# Patient Record
Sex: Female | Born: 1940 | Race: White | Hispanic: No | Marital: Married | Smoking: Former smoker
Health system: Southern US, Community
[De-identification: ages and names within clinical notes are randomized; demographics above are authoritative.]

## PROBLEM LIST (undated history)

## (undated) DIAGNOSIS — J449 Chronic obstructive pulmonary disease, unspecified: Secondary | ICD-10-CM

## (undated) DIAGNOSIS — F419 Anxiety disorder, unspecified: Secondary | ICD-10-CM

## (undated) DIAGNOSIS — F32A Depression, unspecified: Secondary | ICD-10-CM

## (undated) DIAGNOSIS — E119 Type 2 diabetes mellitus without complications: Secondary | ICD-10-CM

## (undated) HISTORY — PX: EYE SURGERY: SHX253

---

## 2020-06-13 ENCOUNTER — Emergency Department: Payer: Medicare Other

## 2020-06-13 ENCOUNTER — Emergency Department
Admission: EM | Admit: 2020-06-13 | Discharge: 2020-06-14 | Disposition: A | Discharge status: Home / Self Care | Payer: Medicare Other | Attending: Emergency Medicine | Admitting: Emergency Medicine

## 2020-06-13 ENCOUNTER — Other Ambulatory Visit: Payer: Self-pay

## 2020-06-13 DIAGNOSIS — Z87891 Personal history of nicotine dependence: Secondary | ICD-10-CM | POA: Insufficient documentation

## 2020-06-13 DIAGNOSIS — T148XXA Other injury of unspecified body region, initial encounter: Secondary | ICD-10-CM

## 2020-06-13 DIAGNOSIS — E119 Type 2 diabetes mellitus without complications: Secondary | ICD-10-CM | POA: Diagnosis not present

## 2020-06-13 DIAGNOSIS — W07XXXA Fall from chair, initial encounter: Secondary | ICD-10-CM | POA: Insufficient documentation

## 2020-06-13 DIAGNOSIS — J449 Chronic obstructive pulmonary disease, unspecified: Secondary | ICD-10-CM | POA: Diagnosis not present

## 2020-06-13 DIAGNOSIS — S52502A Unspecified fracture of the lower end of left radius, initial encounter for closed fracture: Secondary | ICD-10-CM

## 2020-06-13 DIAGNOSIS — S59912A Unspecified injury of left forearm, initial encounter: Secondary | ICD-10-CM | POA: Diagnosis present

## 2020-06-13 DIAGNOSIS — S52602A Unspecified fracture of lower end of left ulna, initial encounter for closed fracture: Secondary | ICD-10-CM

## 2020-06-13 HISTORY — DX: Type 2 diabetes mellitus without complications: E11.9

## 2020-06-13 HISTORY — DX: Chronic obstructive pulmonary disease, unspecified: J44.9

## 2020-06-13 LAB — GLUCOSE, CAPILLARY: Glucose-Capillary: 178 mg/dL — ABNORMAL HIGH (ref 70–99)

## 2020-06-13 MED ORDER — ONDANSETRON HCL 4 MG/2ML IJ SOLN
4.0000 mg | Freq: Once | INTRAMUSCULAR | Status: AC
  Administered 2020-06-13: 4 mg via INTRAVENOUS
  Filled 2020-06-13: qty 2

## 2020-06-13 MED ORDER — ACETAMINOPHEN 500 MG PO TABS
1000.0000 mg | ORAL_TABLET | Freq: Once | ORAL | Status: DC
  Filled 2020-06-13: qty 2

## 2020-06-13 MED ORDER — LACTATED RINGERS IV BOLUS
1000.0000 mL | Freq: Once | INTRAVENOUS | Status: AC
  Administered 2020-06-13: 1000 mL via INTRAVENOUS

## 2020-06-13 MED ORDER — LIDOCAINE HCL (PF) 1 % IJ SOLN
30.0000 mL | Freq: Once | INTRAMUSCULAR | Status: AC
  Administered 2020-06-13: 20 mL
  Filled 2020-06-13: qty 30

## 2020-06-13 MED ORDER — BUPIVACAINE HCL (PF) 0.5 % IJ SOLN
50.0000 mL | Freq: Once | INTRAMUSCULAR | Status: AC
  Administered 2020-06-13: 50 mL
  Filled 2020-06-13: qty 60

## 2020-06-13 MED ORDER — HYDROCODONE-ACETAMINOPHEN 5-325 MG PO TABS: 1.0000 | ORAL_TABLET | Freq: Four times a day (QID) | ORAL | 0 refills | Status: DC | PRN

## 2020-06-13 MED ORDER — KETAMINE HCL 10 MG/ML IJ SOLN
1.0000 mg/kg | Freq: Once | INTRAMUSCULAR | Status: AC
  Administered 2020-06-13: 19.5 mg via INTRAVENOUS
  Filled 2020-06-13: qty 1

## 2020-06-13 MED ORDER — SODIUM BICARBONATE 8.4 % IV SOLN
50.0000 meq | Freq: Once | INTRAVENOUS | Status: DC
  Filled 2020-06-13: qty 50

## 2020-06-13 MED ORDER — FENTANYL CITRATE (PF) 100 MCG/2ML IJ SOLN
25.0000 ug | Freq: Once | INTRAMUSCULAR | Status: AC
  Administered 2020-06-13: 25 ug via INTRAVENOUS
  Filled 2020-06-13: qty 2

## 2020-06-13 NOTE — ED Provider Notes (Signed)
[facility] Emergency Department Provider Note   ____________________________________________   First MD Initiated Contact with Patient 06/13/20 1338     (approximate)  I have reviewed the triage vital signs and the nursing notes.   HISTORY  Chief Complaint Fall and Wrist Pain (left with deformity)   HPI Danielle Yu is a 79 y.o. female is brought to the ED by EMS with complaint of left wrist pain.  Patient states that she was trying to pick up her dog and fell between her chair and her bookcase.  She denies any head injury or loss of consciousness.  She denies any other injury other than her left wrist.  Patient states that she called 911 as her husband is playing golf.  History of COPD and constant use O2 2 L by nasal cannula at home.  Patient also takes Metformin for "prediabetes".  Currently she rates her pain as 10/10.       Past Medical History:  Diagnosis Date  . COPD (chronic obstructive pulmonary disease) (HCC)   . Diabetes mellitus without complication (HCC)     There are no problems to display for this patient.   Prior to Admission medications   Medication Sig Start Date End Date Taking? Authorizing Provider  HYDROcodone-acetaminophen (NORCO/VICODIN) 5-325 MG tablet Take 1 tablet by mouth every 6 (six) hours as needed for moderate pain. 06/13/20   Tommi Rumps, PA-C    Allergies Patient has no known allergies.  No family history on file.  Social History Social History   Tobacco Use  . Smoking status: Former Smoker    Quit date: 2013    Years since quitting: 8.7  . Smokeless tobacco: Never Used  Substance Use Topics  . Alcohol use: Not on file  . Drug use: Not on file    Review of Systems Constitutional: No fever/chills Eyes: No visual changes. ENT: No trauma. Cardiovascular: Denies chest pain. Respiratory: Denies shortness of breath. Gastrointestinal: No abdominal pain.  Positive nausea, no vomiting.     Musculoskeletal: Positive for left wrist pain. Skin: Negative for rash. Neurological: Negative for headaches, focal weakness or numbness.  ____________________________________________   PHYSICAL EXAM:  VITAL SIGNS: ED Triage Vitals  Enc Vitals Group     BP 06/13/20 1322 (!) 126/57     Pulse Rate 06/13/20 1322 88     Resp 06/13/20 1322 18     Temp 06/13/20 1322 98.3 F (36.8 C)     Temp Source 06/13/20 1322 Oral     SpO2 06/13/20 1322 97 %     Weight 06/13/20 1317 145 lb (65.8 kg)     Height 06/13/20 1317 5\' 2"  (1.575 m)     Head Circumference --      Peak Flow --      Pain Score 06/13/20 1317 10     Pain Loc --      Pain Edu? --      Excl. in GC? --     Constitutional: Alert and oriented. Well appearing and in no acute distress. Eyes: Conjunctivae are normal. PERRL. EOMI. Head: Atraumatic. Nose: No congestion/rhinnorhea. Mouth/Throat: Mucous membranes are moist.  Oropharynx non-erythematous. Neck: No stridor.   Cardiovascular: Normal rate, regular rhythm. Grossly normal heart sounds.  Good peripheral circulation. Respiratory: Normal respiratory effort.  No retractions. Lungs CTAB. Gastrointestinal: Soft and nontender. No distention. Musculoskeletal: No lower extremity tenderness nor edema.  No joint effusions. Neurologic:  Normal speech and language. No gross focal neurologic deficits are appreciated.  No gait instability. Skin:  Skin is warm, dry and intact. No rash noted. Psychiatric: Mood and affect are normal. Speech and behavior are normal.  ____________________________________________   LABS (all labs ordered are listed, but only abnormal results are displayed)  Labs Reviewed  GLUCOSE, CAPILLARY - Abnormal; Notable for the following components:      Result Value   Glucose-Capillary 178 (*)    All other components within normal limits  CBG MONITORING, ED   ____________________________________________   RADIOLOGY   Official radiology report(s): DG  Wrist Complete Left  Result Date: 06/13/2020 CLINICAL DATA:  Left wrist injury with deformity. Pt states she was trying to pick up her small dog when she fell between her chair and her bookcase. EXAM: LEFT WRIST - COMPLETE 3+ VIEW COMPARISON:  None. FINDINGS: There are displaced and angulated fractures in the distal radius and in the distal ulna. Carpal bones appear intact. No evidence of dislocation. There is prominent regional soft tissue swelling. IMPRESSION: Displaced and angulated fractures of the distal radius and distal ulna. Electronically Signed   By: Emmaline Kluver M.D.   On: 06/13/2020 14:11    ____________________________________________   PROCEDURES  Procedure(s) performed (including Critical Care):  Procedures   ____________________________________________   INITIAL IMPRESSION / ASSESSMENT AND PLAN / ED COURSE  As part of my medical decision making, I reviewed the following data within the electronic MEDICAL RECORD NUMBER Notes from prior ED visits and [place] Controlled Substance Database  ----------------------------------------- 3:00 PM on 06/13/2020 ----------------------------------------- Spoke with Dr. Hyacinth Meeker who is on-call for orthopedics.  Requested sedation and closed reduction by ED physician.  Sugar tong splint and sling will be applied afterwards.  Patient is to follow-up with Dr. Hyacinth Meeker in the office.  ----------------------------------------- 3:25 PM on 06/13/2020 ----------------------------------------- Dr. Antoine Primas is in [place] to  see the patient and talk to her about procedure for closed reduction of her left distal radial/ulna fracture.  ____________________________________________   FINAL CLINICAL IMPRESSION(S) / ED DIAGNOSES  Final diagnoses:  Closed fracture distal radius and ulna, left, initial encounter     ED Discharge Orders         Ordered    HYDROcodone-acetaminophen (NORCO/VICODIN) 5-325 MG tablet  Every 6 hours PRN         06/13/20 1530          *Please note:  Danielle Yu was evaluated in Emergency Department on 06/13/2020 for the symptoms described in the history of present illness. She was evaluated in the context of the global COVID-19 pandemic, which necessitated consideration that the patient might be at risk for infection with the SARS-CoV-2 virus that causes COVID-19. Institutional protocols and algorithms that pertain to the evaluation of patients at risk for COVID-19 are in a state of rapid change based on information released by regulatory bodies including the CDC and federal and state organizations. These policies and algorithms were followed during the patient's care in the ED.  Some ED evaluations and interventions may be delayed as a result of limited staffing during and the pandemic.*   Note:  This document was prepared using Dragon voice recognition software and may include unintentional dictation errors.    Tommi Rumps, PA-C 06/14/20 1103    Gilles Chiquito, MD 06/14/20 1123

## 2020-06-13 NOTE — Consult Note (Signed)
ORTHOPAEDIC CONSULTATION  REQUESTING PHYSICIAN: Gilles Chiquito, MD  Chief Complaint: Left wrist pain  HPI: Danielle Yu is a 79 y.o. female who complains of left wrist pain after a fall at home this afternoon.  She bent over to pick up her dog and slipped.  She was brought to the emergency room where exam and x-rays reveal a completely displaced left distal radius and ulna fracture.  Attempts at closed reduction under sedation were not really successful and I was asked to come into help with this patient.  The x-rays show a  residual displacement which I think we can improve with hematoma block and closed reduction.  The patient and her husband were agreeable to this.  She is a high risk surgical patient on 3 L of oxygen at home.  Past Medical History:  Diagnosis Date  . COPD (chronic obstructive pulmonary disease) (HCC)   . Diabetes mellitus without complication (HCC)   Were exactly what the hell is Social History   Socioeconomic History  . Marital status: Married    Spouse name: Not on file  . Number of children: Not on file  . Years of education: Not on file  . Highest education level: Not on file  Occupational History  . Not on file  Tobacco Use  . Smoking status: Former Smoker    Quit date: 2013    Years since quitting: 8.7  . Smokeless tobacco: Never Used  Substance and Sexual Activity  . Alcohol use: Not on file  . Drug use: Not on file  . Sexual activity: Not on file  Other Topics Concern  . Not on file  Social History Narrative  . Not on file   Social Determinants of Health   Financial Resource Strain:   . Difficulty of Paying Living Expenses: Not on file  Food Insecurity:   . Worried About Programme researcher, broadcasting/film/video in the Last Year: Not on file  . Ran Out of Food in the Last Year: Not on file  Transportation Needs:   . Lack of Transportation (Medical): Not on file  . Lack of Transportation (Non-Medical): Not on file  Physical Activity:   . Days of Exercise  per Week: Not on file  . Minutes of Exercise per Session: Not on file  Stress:   . Feeling of Stress : Not on file  Social Connections:   . Frequency of Communication with Friends and Family: Not on file  . Frequency of Social Gatherings with Friends and Family: Not on file  . Attends Religious Services: Not on file  . Active Member of Clubs or Organizations: Not on file  . Attends Banker Meetings: Not on file  . Marital Status: Not on file   No family history on file. No Known Allergies Prior to Admission medications   Medication Sig Start Date End Date Taking? Authorizing Provider  HYDROcodone-acetaminophen (NORCO/VICODIN) 5-325 MG tablet Take 1 tablet by mouth every 6 (six) hours as needed for moderate pain. 06/13/20   Tommi Rumps, PA-C   DG Wrist 2 Views Left  Result Date: 06/13/2020 CLINICAL DATA:  Post reduction. EXAM: LEFT WRIST - 2 VIEW COMPARISON:  Left wrist radiographs 06/13/2020 FINDINGS: Redemonstrated displaced fractures of the distal radius and ulna. There is decreased dislocation and angulation of both fractures. There is a remains approximately 8 mm of displacement. No new acute abnormality identified. IMPRESSION: Improved alignment status post reduction. Electronically Signed   By: Emmaline Kluver M.D.   On:  06/13/2020 18:05   DG Wrist Complete Left  Result Date: 06/13/2020 CLINICAL DATA:  Left wrist injury with deformity. Pt states she was trying to pick up her small dog when she fell between her chair and her bookcase. EXAM: LEFT WRIST - COMPLETE 3+ VIEW COMPARISON:  None. FINDINGS: There are displaced and angulated fractures in the distal radius and in the distal ulna. Carpal bones appear intact. No evidence of dislocation. There is prominent regional soft tissue swelling. IMPRESSION: Displaced and angulated fractures of the distal radius and distal ulna. Electronically Signed   By: Emmaline Kluver M.D.   On: 06/13/2020 14:11    Positive ROS: All  other systems have been reviewed and were otherwise negative with the exception of those mentioned in the HPI and as above.  Physical Exam: General: Alert, no acute distress Cardiovascular: No pedal edema Respiratory: No cyanosis, no use of accessory musculature GI: No organomegaly, abdomen is soft and non-tender Skin: No lesions in the area of chief complaint Neurologic: Sensation intact distally Psychiatric: Patient is competent for consent with normal mood and affect Lymphatic: No axillary or cervical lymphadenopathy  MUSCULOSKELETAL: Left wrist remains deformed in a different vendor for correction.  There is moderate swelling.  The skin is intact.  Sensation is good distally.  There is some bruising volarly.  Assessment: Displaced left distal radius and ulnar fractures  Plan: The wrist was prepped sterilely and the both fracture sites infiltrated sterilely with half percent Marcaine and 1% Xylocaine mixture.  After waiting 15 minutes a closed reduction was carried out using fingertrap traction and 10 pounds of countertraction.  X-rays showed improved alignment although not perfect I think this is acceptable and there is no residual angulation of note.  A well-padded sugar tong splint was applied.  The patient will be discharged home to ice and elevate this.  She will take pain medicines as prescribed by the emergency room staff.  She will move the fingers and keep the arm elevated at all times.  She will return to the office in 4 days for exam and x-rays. She was advised to call my office anytime in can come to our urgent care up until 9 PM 7 days a week if necessary.     Valinda Hoar, MD [phone]   06/13/2020 9:28 PM

## 2020-06-13 NOTE — ED Provider Notes (Signed)
.Sedation  Date/Time: 06/13/2020 6:11 PM Performed by: Gilles Chiquito, MD Authorized by: Gilles Chiquito, MD   Consent:    Consent obtained:  Verbal and written   Consent given by:  Patient   Risks discussed:  Allergic reaction, dysrhythmia, inadequate sedation, nausea, vomiting, respiratory compromise necessitating ventilatory assistance and intubation, prolonged sedation necessitating reversal and prolonged hypoxia resulting in organ damage   Alternatives discussed:  Analgesia without sedation Indications:    Procedure performed:  Fracture reduction   Procedure necessitating sedation performed by:  Physician performing sedation Pre-sedation assessment:    NPO status caution: unable to specify NPO status     ASA classification: class 3 - patient with severe systemic disease     Neck mobility: normal     Mallampati score:  II - soft palate, uvula, fauces visible   Pre-sedation assessments completed and reviewed: pre-procedure airway patency not reviewed, pre-procedure cardiovascular function not reviewed, pre-procedure hydration status not reviewed, pre-procedure mental status not reviewed, pre-procedure nausea and vomiting status not reviewed, pre-procedure pain level not reviewed, pre-procedure respiratory function not reviewed and pre-procedure temperature not reviewed   Immediate pre-procedure details:    Reassessment: Patient reassessed immediately prior to procedure     Reviewed: vital signs and relevant labs/tests     Verified: bag valve mask available, emergency equipment available, intubation equipment available, IV patency confirmed, oxygen available and suction available   Procedure details (see MAR for exact dosages):    Preoxygenation:  Nasal cannula   Sedation:  Ketamine   Intended level of sedation: moderate (conscious sedation)   Analgesia:  None   Intra-procedure monitoring:  Blood pressure monitoring, cardiac monitor, continuous capnometry, continuous pulse oximetry,  frequent LOC assessments and frequent vital sign checks   Intra-procedure events: none     Total Provider sedation time (minutes):  15 Post-procedure details:    Attendance: Constant attendance by certified staff until patient recovered   Reduction of fracture  Date/Time: 06/13/2020 6:12 PM Performed by: Gilles Chiquito, MD Authorized by: Gilles Chiquito, MD  Local anesthesia used: no  Anesthesia: Local anesthesia used: no  Sedation: Patient sedated: yes Sedation type: moderate (conscious) sedation Sedatives: ketamine Vitals: Vital signs were monitored during sedation.  Patient tolerance: patient tolerated the procedure well with no immediate complications    I was asked to see this patient by PA Bridget Hartshorn.  Please see her initial H&P for full details regarding patient's presentation.  In brief patient presents with left wrist pain after a ground-level mechanical fall.  Initial XR remarkable for: Displaced and angulated fractures of the distal radius and distal Ulna.  On my assessment patient has sensation intact in attribution radial ulnar and median nerves although significant weakness on grip strength compared to the right hand.  She has a palpable pulse radial pulse with a significant deformity at the wrist and significant edema and tenderness.  Closed reduction attempted with ketamine sedation as noted above after orthopedic service engaged who recommended bedside reduction under sedation.Marland Kitchen  However on post reduction x-ray patient still has significant displacement.  Per orthopedic attending Dr. Hyacinth Meeker he will come to the bedside to do a hematoma block and attempt reduction himself.  Close reduction performed Dr. Hyacinth Meeker.  Please see his note for full details regarding his reduction and hematoma block.  Patient placed in sugar tong splint after reduction.  Rx already written for analgesia.  Patient will follow up with Dr. Hyacinth Meeker in his clinic early this coming week.   Discharged  stable condition.  Strict precautions provided and discussed.   Gilles Chiquito, MD 06/13/20 2124

## 2020-06-13 NOTE — ED Notes (Signed)
Husband updated on moderate sedation discharge instructions.

## 2020-06-13 NOTE — ED Notes (Signed)
Pt continues to wait for ortho

## 2020-06-13 NOTE — ED Notes (Signed)
See triage note- pt c/o continued left wrist pain.

## 2020-06-13 NOTE — ED Notes (Signed)
Keeping pt NPO at this time until next steps of care are determined by EDP

## 2020-06-13 NOTE — ED Notes (Signed)
Husband at bedside.  

## 2020-06-13 NOTE — ED Notes (Signed)
Ice applied to left wrist, pt assisted into bed.

## 2020-06-13 NOTE — Sedation Documentation (Signed)
Splint applied by Myra, ED Tech to left arm

## 2020-06-13 NOTE — ED Triage Notes (Signed)
Pt states she was trying to pick up her small dog when she fell between her chair and her bookcase- pt left wrist is painful with deformity- EMS splinted the wrist and pt took her wrist out because she states the splint made it hurt worse- pt crying in triage room

## 2020-06-13 NOTE — ED Notes (Signed)
Unable to call husband due to no number listed, offered to call daughter. Pt states that she is unsure if she knows number. Consent signed before hand and moderate sedation consent signed prior to.

## 2020-06-13 NOTE — Sedation Documentation (Signed)
Order for 19.5mg  of ketamine IV verified by Dr. Antoine Primas at bedside. Given by Dr. Katrinka Blazing

## 2020-06-13 NOTE — ED Notes (Signed)
See first nurse note. Pt has obvious deformity to left wrist. Moaning in pain.

## 2020-06-13 NOTE — ED Notes (Signed)
Ortho in with pt.

## 2020-06-13 NOTE — ED Notes (Signed)
Attempt to call pt husband's unsuccessful. Unable to leave message.

## 2020-06-14 ENCOUNTER — Telehealth: Payer: Self-pay | Admitting: Physician Assistant

## 2020-06-14 ENCOUNTER — Telehealth: Payer: Self-pay | Admitting: Emergency Medicine

## 2020-06-14 MED ORDER — HYDROCODONE-ACETAMINOPHEN 5-325 MG PO TABS
2.0000 | ORAL_TABLET | Freq: Four times a day (QID) | ORAL | 0 refills | Status: AC | PRN
Start: 2020-06-14 — End: ?

## 2020-06-14 MED ORDER — HYDROCODONE-ACETAMINOPHEN 5-325 MG PO TABS
1.0000 | ORAL_TABLET | Freq: Four times a day (QID) | ORAL | 0 refills | Status: DC | PRN
Start: 2020-06-14 — End: 2020-07-07

## 2020-06-14 NOTE — Telephone Encounter (Cosign Needed)
Patient requested a different pharmacy

## 2020-06-14 NOTE — Telephone Encounter (Cosign Needed)
Received telephone call for the, Danielle Yu.  She reported that the documented prescription that was submitted electronically on her behalf is not available at to local [facility] pharmacies as expected.  I reviewed the chart, and realized that the prescription was in fact submitted but has not been transmitted electronically.  The chart is treated at the prescription is currently in progress from the patient's visit yesterday.    Notify the patient that I would resubmit the electronic order for the narcotic medicine as written.  Patient can report to the pharmacy in the morning to fill the prescription as prescribed.  She will follow-up as previously directed.

## 2020-07-06 ENCOUNTER — Other Ambulatory Visit: Payer: Self-pay | Admitting: Specialist

## 2020-07-06 ENCOUNTER — Encounter: Payer: Self-pay | Admitting: Specialist

## 2020-07-06 NOTE — H&P (Signed)
PREOPERATIVE H&P  Chief Complaint: W10.272Z Colles' fracture of left radius, init for clos fx  HPI: Danielle Yu is a 79 y.o. female who presents for preoperative history and physical with a diagnosis of S52.532A Colles' fracture of left radius, init for clos fx. Symptoms are rated as moderate to severe, and have been worsening.  There is severe displacement. This is significantly impairing activities of daily living.  She has elected for surgical management.   Past Medical History:  Diagnosis Date   COPD (chronic obstructive pulmonary disease) (HCC)    Diabetes mellitus without complication (HCC)    History reviewed. No pertinent surgical history. Social History   Socioeconomic History   Marital status: Married    Spouse name: Not on file   Number of children: Not on file   Years of education: Not on file   Highest education level: Not on file  Occupational History   Not on file  Tobacco Use   Smoking status: Former Smoker    Quit date: 2013    Years since quitting: 8.8   Smokeless tobacco: Never Used  Substance and Sexual Activity   Alcohol use: Not on file   Drug use: Not on file   Sexual activity: Not on file  Other Topics Concern   Not on file  Social History Narrative   Not on file   Social Determinants of Health   Financial Resource Strain:    Difficulty of Paying Living Expenses: Not on file  Food Insecurity:    Worried About Programme researcher, broadcasting/film/video in the Last Year: Not on file   [employer] of Food in the Last Year: Not on file  Transportation Needs:    Lack of Transportation (Medical): Not on file   Lack of Transportation (Non-Medical): Not on file  Physical Activity:    Days of Exercise per Week: Not on file   Minutes of Exercise per Session: Not on file  Stress:    Feeling of Stress : Not on file  Social Connections:    Frequency of Communication with Friends and Family: Not on file   Frequency of Social Gatherings with Friends and  Family: Not on file   Attends Religious Services: Not on file   Active Member of Clubs or Organizations: Not on file   Attends Banker Meetings: Not on file   Marital Status: Not on file   No family history on file. No Known Allergies Prior to Admission medications   Medication Sig Start Date End Date Taking? Authorizing Provider  HYDROcodone-acetaminophen (NORCO) 5-325 MG tablet Take 2 tablets by mouth every 6 (six) hours as needed for moderate pain. 06/14/20   Enid Derry, PA-C  HYDROcodone-acetaminophen (NORCO/VICODIN) 5-325 MG tablet Take 1 tablet by mouth every 6 (six) hours as needed for moderate pain. 06/14/20   Menshew, Charlesetta Ivory, PA-C     Positive ROS: All other systems have been reviewed and were otherwise negative with the exception of those mentioned in the HPI and as above.  Physical Exam: General: Alert, no acute distress Cardiovascular: No pedal edema. Heart is regular and without murmur.  Respiratory: No cyanosis, no use of accessory musculature. Lungs are clear. GI: No organomegaly, abdomen is soft and non-tender Skin: No lesions in the area of chief complaint Neurologic: Sensation intact distally Psychiatric: Patient is competent for consent with normal mood and affect Lymphatic: No axillary or cervical lymphadenopathy  MUSCULOSKELETAL: Left wrist is deformed and painful.  Radial shortening present. CSM good and  skin intact.  Assessment: S52.532A Colles' fracture of left radius, init for clos fx  Plan: Plan for Procedure(s): ORIF Left Colles Fracture  The risks benefits and alternatives were discussed with the patient including but not limited to the risks of nonoperative treatment, versus surgical intervention including infection, bleeding, nerve injury,  blood clots, cardiopulmonary complications, morbidity, mortality, among others, and they were willing to proceed.   Valinda Hoar, MD [phone]   07/06/2020 6:43 PM

## 2020-07-09 ENCOUNTER — Encounter
Admission: RE | Admit: 2020-07-09 | Discharge: 2020-07-09 | Disposition: A | Discharge status: Home / Self Care | Payer: Medicare Other | Source: Ambulatory Visit | Attending: Sports Medicine | Admitting: Sports Medicine

## 2020-07-09 HISTORY — DX: Anxiety disorder, unspecified: F41.9

## 2020-07-09 HISTORY — DX: Depression, unspecified: F32.A

## 2020-07-09 NOTE — Patient Instructions (Addendum)
Your procedure is scheduled on: 07/13/20- Monday Report to Day Surgery on the 2nd floor of [facility]. To find out your arrival time, please call [phone] between 1PM - 3PM on: 07/10/20  REMEMBER: Instructions that are not followed completely may result in serious medical risk, up to and including death; or upon the discretion of your surgeon and anesthesiologist your surgery may need to be rescheduled.  Do not eat food after midnight the night before surgery.  No gum chewing, lozengers or hard candies.  You may however, drink CLEAR liquids up to 2 hours before you are scheduled to arrive for your surgery. Do not drink anything within 2 hours of your scheduled arrival time. Type 1 and Type 2 diabetics should only drink water.  TAKE THESE MEDICATIONS THE MORNING OF SURGERY WITH A SIP OF WATER:  - gabapentin (NEURONTIN) 100 MG capsule - SPIRIVA HANDIHALER 18 MCG inhalation capsule - Oxygen   Use inhaler, albuterol (VENTOLIN HFA) 108 (90 Base) MCG/ACT inhaler on the day of surgery and bring to the hospital.  Stop Metformin 2 days prior to surgery, do not take 10/30 and 10/31, do not take the morning of surgery.  One week prior to surgery: Stop Anti-inflammatories (NSAIDS) such as Advil, Aleve, Ibuprofen, Motrin, Naproxen, Naprosyn and Aspirin based products such as Excedrin, Goodys Powder, BC Powder.  Stop ANY OVER THE COUNTER supplements until after surgery. Calcium-Cholecalciferol-Zinc (VIACTIV CALCIUM IMMUNE) 650-20-5.5 MG-MCG-MG CHEW , stop today- 07/09/20.  (You may continue taking Tylenol, Vitamin D, Vitamin B, and multivitamin.)  No Alcohol for 24 hours before or after surgery.  No Smoking including e-cigarettes for 24 hours prior to surgery.  No chewable tobacco products for at least 6 hours prior to surgery.  No nicotine patches on the day of surgery.  Do not use any "recreational" drugs for at least a week prior to your surgery.  Please be advised that the  combination of cocaine and anesthesia may have negative outcomes, up to and including death. If you test positive for cocaine, your surgery will be cancelled.  On the morning of surgery brush your teeth with toothpaste and water, you may rinse your mouth with mouthwash if you wish. Do not swallow any toothpaste or mouthwash.  Do not wear jewelry, make-up, hairpins, clips or nail polish.  Do not wear lotions, powders, or perfumes.   Do not shave 48 hours prior to surgery.   Contact lenses, hearing aids and dentures may not be worn into surgery.  Do not bring valuables to the hospital. [facility] is not responsible for any missing/lost belongings or valuables.   Use CHG Soap or wipes as directed on instruction sheet.  Notify your doctor if there is any change in your medical condition (cold, fever, infection).  Wear comfortable clothing (specific to your surgery type) to the hospital.  Plan for stool softeners for home use; pain medications have a tendency to cause constipation. You can also help prevent constipation by eating foods high in fiber such as fruits and vegetables and drinking plenty of fluids as your diet allows.  After surgery, you can help prevent lung complications by doing breathing exercises.  Take deep breaths and cough every 1-2 hours. Your doctor may order a device called an Incentive Spirometer to help you take deep breaths. When coughing or sneezing, hold a pillow firmly against your incision with both hands. This is called "splinting." Doing this helps protect your incision. It also decreases belly discomfort.  If you are  being admitted to the hospital overnight, leave your suitcase in the car. After surgery it may be brought to your room.  If you are being discharged the day of surgery, you will not be allowed to drive home. You will need a responsible adult (18 years or older) to drive you home and stay with you that night.   If you are taking public  transportation, you will need to have a responsible adult (18 years or older) with you. Please confirm with your physician that it is acceptable to use public transportation.   Please call the Pre-admissions Testing Dept. at [phone] if you have any questions about these instructions.  Visitation Policy:  Patients undergoing a surgery or procedure may have one family member or support person with them as long as that person is not COVID-19 positive or experiencing its symptoms.  That person may remain in the waiting area during the procedure.  Inpatient Visitation Update:   In an effort to ensure the safety of our team members and our patients, we are implementing a change to our visitation policy:  Effective Monday, Aug. 9, at 7 a.m., inpatients will be allowed one support person.  o The support person may change daily.  o The support person must pass our screening, gel in and out, and wear a mask at all times, including in the patient's room.  o Patients must also wear a mask when staff or their support person are in the room.  o Masking is required regardless of vaccination status.  Systemwide, no visitors 17 or younger.

## 2020-07-10 ENCOUNTER — Other Ambulatory Visit
Admission: RE | Admit: 2020-07-10 | Discharge: 2020-07-10 | Disposition: A | Discharge status: Home / Self Care | Payer: Medicare Other | Source: Ambulatory Visit | Attending: Specialist | Admitting: Specialist

## 2020-07-10 ENCOUNTER — Other Ambulatory Visit: Payer: Self-pay

## 2020-07-10 DIAGNOSIS — Z01812 Encounter for preprocedural laboratory examination: Secondary | ICD-10-CM | POA: Insufficient documentation

## 2020-07-10 DIAGNOSIS — Z20822 Contact with and (suspected) exposure to covid-19: Secondary | ICD-10-CM | POA: Diagnosis not present

## 2020-07-10 LAB — BASIC METABOLIC PANEL
Anion gap: 11 (ref 5–15)
BUN: 20 mg/dL (ref 8–23)
CO2: 25 mmol/L (ref 22–32)
Calcium: 8.9 mg/dL (ref 8.9–10.3)
Chloride: 100 mmol/L (ref 98–111)
Creatinine, Ser: 0.9 mg/dL (ref 0.44–1.00)
GFR, Estimated: >60 mL/min (ref >60)
Glucose, Bld: 88 mg/dL (ref 70–99)
Potassium: 4 mmol/L (ref 3.5–5.1)
Sodium: 136 mmol/L (ref 135–145)

## 2020-07-10 LAB — CBC
HCT: 36.4 % (ref 36.0–46.0)
Hemoglobin: 11.2 g/dL — ABNORMAL LOW (ref 12.0–15.0)
MCH: 26.7 pg (ref 26.0–34.0)
MCHC: 30.8 g/dL (ref 30.0–36.0)
MCV: 86.9 fL (ref 80.0–100.0)
Platelets: 307 10*3/uL (ref 150–400)
RBC: 4.19 MIL/uL (ref 3.87–5.11)
RDW: 15.7 % — ABNORMAL HIGH (ref 11.5–15.5)
WBC: 8.9 10*3/uL (ref 4.0–10.5)
nRBC: 0 % (ref 0.0–0.2)

## 2020-07-11 LAB — SARS CORONAVIRUS 2 (TAT 6-24 HRS): SARS Coronavirus 2: NEGATIVE

## 2020-07-13 ENCOUNTER — Ambulatory Visit
Admission: RE | Admit: 2020-07-13 | Discharge: 2020-07-13 | Disposition: A | Discharge status: Home / Self Care | Payer: Medicare Other | Attending: Specialist | Admitting: Specialist

## 2020-07-13 ENCOUNTER — Encounter: Payer: Self-pay | Admitting: Specialist

## 2020-07-13 ENCOUNTER — Ambulatory Visit: Payer: Medicare Other | Admitting: Urgent Care

## 2020-07-13 ENCOUNTER — Ambulatory Visit: Payer: Medicare Other

## 2020-07-13 ENCOUNTER — Other Ambulatory Visit: Payer: Self-pay

## 2020-07-13 ENCOUNTER — Encounter
Admission: RE | Disposition: A | Discharge status: Home / Self Care | Payer: Self-pay | Source: Home / Self Care | Attending: Specialist

## 2020-07-13 DIAGNOSIS — Z87891 Personal history of nicotine dependence: Secondary | ICD-10-CM | POA: Diagnosis not present

## 2020-07-13 DIAGNOSIS — Z9981 Dependence on supplemental oxygen: Secondary | ICD-10-CM | POA: Diagnosis not present

## 2020-07-13 DIAGNOSIS — X58XXXA Exposure to other specified factors, initial encounter: Secondary | ICD-10-CM | POA: Insufficient documentation

## 2020-07-13 DIAGNOSIS — J449 Chronic obstructive pulmonary disease, unspecified: Secondary | ICD-10-CM | POA: Insufficient documentation

## 2020-07-13 DIAGNOSIS — Z419 Encounter for procedure for purposes other than remedying health state, unspecified: Secondary | ICD-10-CM

## 2020-07-13 DIAGNOSIS — S52532A Colles' fracture of left radius, initial encounter for closed fracture: Secondary | ICD-10-CM | POA: Insufficient documentation

## 2020-07-13 HISTORY — PX: OPEN REDUCTION INTERNAL FIXATION (ORIF) DISTAL RADIAL FRACTURE: SHX5989

## 2020-07-13 LAB — GLUCOSE, CAPILLARY
Glucose-Capillary: 102 mg/dL — ABNORMAL HIGH (ref 70–99)
Glucose-Capillary: 136 mg/dL — ABNORMAL HIGH (ref 70–99)

## 2020-07-13 SURGERY — OPEN REDUCTION INTERNAL FIXATION (ORIF) DISTAL RADIUS FRACTURE
Anesthesia: General | Site: Wrist | Laterality: Left

## 2020-07-13 MED ORDER — EPINEPHRINE PF 1 MG/ML IJ SOLN
INTRAMUSCULAR | Status: AC
  Filled 2020-07-13: qty 1

## 2020-07-13 MED ORDER — PROPOFOL 10 MG/ML IV BOLUS
INTRAVENOUS | Status: AC
  Filled 2020-07-13: qty 20

## 2020-07-13 MED ORDER — LIDOCAINE HCL (PF) 1 % IJ SOLN
INTRAMUSCULAR | Status: AC
  Filled 2020-07-13: qty 30

## 2020-07-13 MED ORDER — HYDROCODONE-ACETAMINOPHEN 7.5-325 MG PO TABS
1.0000 | ORAL_TABLET | Freq: Once | ORAL | Status: DC | PRN
  Filled 2020-07-13: qty 1

## 2020-07-13 MED ORDER — DEXMEDETOMIDINE (PRECEDEX) IN NS 20 MCG/5ML (4 MCG/ML) IV SYRINGE
PREFILLED_SYRINGE | INTRAVENOUS | Status: DC | PRN
  Administered 2020-07-13: 8 ug via INTRAVENOUS
  Administered 2020-07-13: 4 ug via INTRAVENOUS

## 2020-07-13 MED ORDER — SODIUM CHLORIDE 0.9 % IV SOLN: INTRAVENOUS | Status: DC

## 2020-07-13 MED ORDER — SODIUM CHLORIDE 0.9 % IV SOLN
INTRAVENOUS | Status: DC | PRN
  Administered 2020-07-13: 40 ug/min via INTRAVENOUS

## 2020-07-13 MED ORDER — FAMOTIDINE 20 MG PO TABS: 20.0000 mg | ORAL_TABLET | Freq: Once | ORAL | Status: AC

## 2020-07-13 MED ORDER — NEOMYCIN-POLYMYXIN B GU 40-200000 IR SOLN
Status: DC | PRN
  Administered 2020-07-13: 2 mL

## 2020-07-13 MED ORDER — BUPIVACAINE HCL (PF) 0.5 % IJ SOLN
INTRAMUSCULAR | Status: AC
  Filled 2020-07-13: qty 30

## 2020-07-13 MED ORDER — CLINDAMYCIN PHOSPHATE 600 MG/50ML IV SOLN
600.0000 mg | INTRAVENOUS | Status: AC
  Administered 2020-07-13: 600 mg via INTRAVENOUS

## 2020-07-13 MED ORDER — ONDANSETRON HCL 4 MG/2ML IJ SOLN
INTRAMUSCULAR | Status: AC
  Filled 2020-07-13: qty 2

## 2020-07-13 MED ORDER — ACETAMINOPHEN 160 MG/5ML PO SOLN
325.0000 mg | ORAL | Status: DC | PRN
  Filled 2020-07-13: qty 20.3

## 2020-07-13 MED ORDER — DEXMEDETOMIDINE (PRECEDEX) IN NS 20 MCG/5ML (4 MCG/ML) IV SYRINGE
PREFILLED_SYRINGE | INTRAVENOUS | Status: AC
  Filled 2020-07-13: qty 5

## 2020-07-13 MED ORDER — CHLORHEXIDINE GLUCONATE CLOTH 2 % EX PADS
6.0000 | MEDICATED_PAD | Freq: Once | CUTANEOUS | Status: AC
  Administered 2020-07-13: 6 via TOPICAL

## 2020-07-13 MED ORDER — CLINDAMYCIN PHOSPHATE 600 MG/50ML IV SOLN
INTRAVENOUS | Status: AC
  Filled 2020-07-13: qty 50

## 2020-07-13 MED ORDER — BUPIVACAINE HCL (PF) 0.5 % IJ SOLN
INTRAMUSCULAR | Status: DC | PRN
  Administered 2020-07-13: 25 mL

## 2020-07-13 MED ORDER — PHENYLEPHRINE HCL (PRESSORS) 10 MG/ML IV SOLN
INTRAVENOUS | Status: DC | PRN
  Administered 2020-07-13 (×4): 100 ug via INTRAVENOUS

## 2020-07-13 MED ORDER — FENTANYL CITRATE (PF) 100 MCG/2ML IJ SOLN: 50.0000 ug | Freq: Once | INTRAMUSCULAR | Status: DC

## 2020-07-13 MED ORDER — FENTANYL CITRATE (PF) 100 MCG/2ML IJ SOLN
INTRAMUSCULAR | Status: AC
  Administered 2020-07-13: 25 ug via INTRAVENOUS
  Filled 2020-07-13: qty 2

## 2020-07-13 MED ORDER — FAMOTIDINE 20 MG PO TABS
ORAL_TABLET | ORAL | Status: AC
  Administered 2020-07-13: 20 mg via ORAL
  Filled 2020-07-13: qty 1

## 2020-07-13 MED ORDER — DEXAMETHASONE SODIUM PHOSPHATE 10 MG/ML IJ SOLN
INTRAMUSCULAR | Status: AC
  Filled 2020-07-13: qty 1

## 2020-07-13 MED ORDER — BUPIVACAINE HCL (PF) 0.5 % IJ SOLN
INTRAMUSCULAR | Status: AC
  Filled 2020-07-13: qty 10

## 2020-07-13 MED ORDER — DEXMEDETOMIDINE (PRECEDEX) IN NS 20 MCG/5ML (4 MCG/ML) IV SYRINGE
PREFILLED_SYRINGE | INTRAVENOUS | Status: AC
  Filled 2020-07-13: qty 10

## 2020-07-13 MED ORDER — PROPOFOL 500 MG/50ML IV EMUL
INTRAVENOUS | Status: AC
  Filled 2020-07-13: qty 100

## 2020-07-13 MED ORDER — MIDAZOLAM HCL 2 MG/2ML IJ SOLN
INTRAMUSCULAR | Status: AC
  Filled 2020-07-13: qty 2

## 2020-07-13 MED ORDER — CEFAZOLIN SODIUM-DEXTROSE 2-4 GM/100ML-% IV SOLN
INTRAVENOUS | Status: AC
  Filled 2020-07-13: qty 100

## 2020-07-13 MED ORDER — FENTANYL CITRATE (PF) 100 MCG/2ML IJ SOLN: 25.0000 ug | Freq: Once | INTRAMUSCULAR | Status: AC

## 2020-07-13 MED ORDER — FENTANYL CITRATE (PF) 100 MCG/2ML IJ SOLN
INTRAMUSCULAR | Status: AC
  Filled 2020-07-13: qty 2

## 2020-07-13 MED ORDER — LIDOCAINE HCL (PF) 1 % IJ SOLN
INTRAMUSCULAR | Status: AC
  Filled 2020-07-13: qty 5

## 2020-07-13 MED ORDER — ONDANSETRON HCL 4 MG/2ML IJ SOLN: 4.0000 mg | Freq: Once | INTRAMUSCULAR | Status: DC | PRN

## 2020-07-13 MED ORDER — ACETAMINOPHEN 325 MG PO TABS: 325.0000 mg | ORAL_TABLET | ORAL | Status: DC | PRN

## 2020-07-13 MED ORDER — CEFAZOLIN SODIUM-DEXTROSE 2-4 GM/100ML-% IV SOLN
2.0000 g | INTRAVENOUS | Status: AC
  Administered 2020-07-13: 2 g via INTRAVENOUS

## 2020-07-13 MED ORDER — LIDOCAINE HCL 1 % IJ SOLN
INTRAMUSCULAR | Status: DC | PRN
  Administered 2020-07-13: 10 mL

## 2020-07-13 MED ORDER — GABAPENTIN 300 MG PO CAPS: 300.0000 mg | ORAL_CAPSULE | ORAL | Status: DC

## 2020-07-13 MED ORDER — MELOXICAM 7.5 MG PO TABS
ORAL_TABLET | ORAL | Status: AC
  Administered 2020-07-13: 15 mg via ORAL
  Filled 2020-07-13: qty 2

## 2020-07-13 MED ORDER — ORAL CARE MOUTH RINSE: 15.0000 mL | Freq: Once | OROMUCOSAL | Status: AC

## 2020-07-13 MED ORDER — CHLORHEXIDINE GLUCONATE 0.12 % MT SOLN: 15.0000 mL | Freq: Once | OROMUCOSAL | Status: AC

## 2020-07-13 MED ORDER — PROPOFOL 500 MG/50ML IV EMUL
INTRAVENOUS | Status: DC | PRN
  Administered 2020-07-13: 40 ug/kg/min via INTRAVENOUS

## 2020-07-13 MED ORDER — DROPERIDOL 2.5 MG/ML IJ SOLN
0.6250 mg | Freq: Once | INTRAMUSCULAR | Status: DC | PRN
  Filled 2020-07-13: qty 2

## 2020-07-13 MED ORDER — LIDOCAINE HCL (PF) 2 % IJ SOLN
INTRAMUSCULAR | Status: AC
  Filled 2020-07-13: qty 10

## 2020-07-13 MED ORDER — FENTANYL CITRATE (PF) 100 MCG/2ML IJ SOLN
25.0000 ug | Freq: Once | INTRAMUSCULAR | Status: AC
  Administered 2020-07-13: 25 ug via INTRAVENOUS

## 2020-07-13 MED ORDER — MELOXICAM 7.5 MG PO TABS: 15.0000 mg | ORAL_TABLET | ORAL | Status: AC

## 2020-07-13 MED ORDER — ONDANSETRON HCL 4 MG/2ML IJ SOLN
INTRAMUSCULAR | Status: DC | PRN
  Administered 2020-07-13: 4 mg via INTRAVENOUS

## 2020-07-13 MED ORDER — EPHEDRINE SULFATE 50 MG/ML IJ SOLN
INTRAMUSCULAR | Status: DC | PRN
  Administered 2020-07-13: 5 mg via INTRAVENOUS

## 2020-07-13 MED ORDER — HYDROCODONE-ACETAMINOPHEN 5-325 MG PO TABS: 1.0000 | ORAL_TABLET | Freq: Four times a day (QID) | ORAL | 0 refills | Status: AC | PRN

## 2020-07-13 MED ORDER — PROPOFOL 10 MG/ML IV BOLUS
INTRAVENOUS | Status: DC | PRN
  Administered 2020-07-13 (×3): 20 mg via INTRAVENOUS

## 2020-07-13 MED ORDER — CHLORHEXIDINE GLUCONATE 0.12 % MT SOLN
OROMUCOSAL | Status: AC
  Administered 2020-07-13: 15 mL via OROMUCOSAL
  Filled 2020-07-13: qty 15

## 2020-07-13 MED ORDER — MELOXICAM 15 MG PO TABS: 15.0000 mg | ORAL_TABLET | Freq: Every day | ORAL | 3 refills | Status: AC

## 2020-07-13 MED ORDER — LIDOCAINE HCL (CARDIAC) PF 100 MG/5ML IV SOSY
PREFILLED_SYRINGE | INTRAVENOUS | Status: DC | PRN
  Administered 2020-07-13: 50 mg via INTRATRACHEAL

## 2020-07-13 SURGICAL SUPPLY — 54 items
APL PRP STRL LF DISP 70% ISPRP (MISCELLANEOUS) ×1
BLADE SURG MINI STRL (BLADE) ×2 IMPLANT
BNDG COHESIVE 4X5 TAN STRL (GAUZE/BANDAGES/DRESSINGS) IMPLANT
BNDG ESMARK 4X12 TAN STRL LF (GAUZE/BANDAGES/DRESSINGS) ×2 IMPLANT
CANISTER SUCT 1200ML W/VALVE (MISCELLANEOUS) ×2 IMPLANT
CHLORAPREP W/TINT 26 (MISCELLANEOUS) ×2 IMPLANT
COVER WAND RF STERILE (DRAPES) ×2 IMPLANT
CUFF TOURN SGL QUICK 18X4 (TOURNIQUET CUFF) IMPLANT
DRAPE FLUOR MINI C-ARM 54X84 (DRAPES) ×2 IMPLANT
DRSG GAUZE FLUFF 36X18 (GAUZE/BANDAGES/DRESSINGS) ×2 IMPLANT
ELECT REM PT RETURN 9FT ADLT (ELECTROSURGICAL) ×2
ELECTRODE REM PT RTRN 9FT ADLT (ELECTROSURGICAL) ×1 IMPLANT
GAUZE SPONGE 4X4 12PLY STRL (GAUZE/BANDAGES/DRESSINGS) ×2 IMPLANT
GAUZE XEROFORM 1X8 LF (GAUZE/BANDAGES/DRESSINGS) ×2 IMPLANT
GLOVE INDICATOR 8.0 STRL GRN (GLOVE) ×2 IMPLANT
GLOVE SURG ORTHO 8.5 STRL (GLOVE) ×3 IMPLANT
GOWN STRL REUS W/ TWL LRG LVL3 (GOWN DISPOSABLE) ×1 IMPLANT
GOWN STRL REUS W/TWL LRG LVL3 (GOWN DISPOSABLE) ×2
GOWN STRL REUS W/TWL LRG LVL4 (GOWN DISPOSABLE) ×2 IMPLANT
GUIDEWIRE PIN ORTH 6X1.6XSMTH (WIRE) IMPLANT
K-WIRE 1.6 (WIRE) ×4
KIT TURNOVER KIT A (KITS) ×2 IMPLANT
NDL HPO THNWL 1X22GA REG BVL (NEEDLE) IMPLANT
NDL SAFETY ECLIPSE 18X1.5 (NEEDLE) ×1 IMPLANT
NEEDLE HYPO 18GX1.5 SHARP (NEEDLE) ×2
NEEDLE SAFETY 22GX1 (NEEDLE) ×2
NS IRRIG 500ML POUR BTL (IV SOLUTION) ×2 IMPLANT
PACK EXTREMITY (MISCELLANEOUS) ×2 IMPLANT
PADDING CAST 4IN STRL (MISCELLANEOUS) ×2
PADDING CAST BLEND 4X4 STRL (MISCELLANEOUS) ×2 IMPLANT
PEG SUBCHONDRAL SMOOTH 2.0X18 (Peg) ×2 IMPLANT
PEG SUBCHONDRAL SMOOTH 2.0X20 (Peg) ×1 IMPLANT
PEG THREADED 2.5MMX20MM LONG (Peg) IMPLANT
PLATE SHORT 21.6X48.9 NRRW LT (Plate) ×1 IMPLANT
SCREW BN 12X3.5XNS CORT TI (Screw) IMPLANT
SCREW CORT 3.5X12 (Screw) ×6 IMPLANT
SCREW CORT 3.5X14 LNG (Screw) ×1 IMPLANT
SCREW PEG LOCK 2.5X18 (Peg) ×1 IMPLANT
SCREW PEG LOCK 2.5X20 (Peg) ×3 IMPLANT
SLING ARM M TX990204 (SOFTGOODS) ×1 IMPLANT
SPLINT CAST 1 STEP 3X12 (MISCELLANEOUS) ×2 IMPLANT
SPONGE LAP 18X18 RF (DISPOSABLE) ×2 IMPLANT
STAPLER SKIN PROX 35W (STAPLE) ×2 IMPLANT
STOCKINETTE 48X4 2 PLY STRL (GAUZE/BANDAGES/DRESSINGS) ×1 IMPLANT
STOCKINETTE BIAS CUT 4 980044 (GAUZE/BANDAGES/DRESSINGS) ×2 IMPLANT
STOCKINETTE STRL 4IN 9604848 (GAUZE/BANDAGES/DRESSINGS) ×2 IMPLANT
SUT VIC AB 2-0 SH 27 (SUTURE) ×2
SUT VIC AB 2-0 SH 27XBRD (SUTURE) ×1 IMPLANT
SUT VIC AB 3-0 PS2 18 (SUTURE) ×2 IMPLANT
SUT VIC AB 3-0 SH 27 (SUTURE) ×2
SUT VIC AB 3-0 SH 27X BRD (SUTURE) ×1 IMPLANT
SUT VIC AB 4-0 RB1 27 (SUTURE) ×4
SUT VIC AB 4-0 RB1 27X BRD (SUTURE) IMPLANT
SYR 30ML LL (SYRINGE) ×1 IMPLANT

## 2020-07-13 NOTE — Op Note (Signed)
07/13/2020  10:20 AM  PATIENT:  Danielle Yu    PRE-OPERATIVE DIAGNOSIS:  S52.532A Colles' fracture of left radius, init for clos fx  POST-OPERATIVE DIAGNOSIS:  Same  PROCEDURE:  ORIF Left Colles Fracture, severely displaced  SURGEON:  Valinda Hoar, MD  TOURNIQUET TIME:  85  MIN  ANESTHESIA:  Axilllary block and local  PREOPERATIVE INDICATIONS:  Danielle Yu is a  79 y.o. female with a diagnosis of S52.532A Colles' fracture of left radius, init for clos fx who failed conservative measures and elected for surgical management.  The fracture remained severely displaced and shortened.   The risks benefits and alternatives were discussed with the patient preoperatively including but not limited to the risks of infection, bleeding, nerve injury, malunion, nonunion, wrist stiffness, persistent wrist pain, osteoarthritis and the need for further surgery. Medical risks include but are not limited to DVT and pulmonary embolism, myocardial infarction, stroke, pneumonia, respiratory failure and death. Patient  understood these risks and wished to proceed.   OPERATIVE IMPLANTS: Biomet hand innovations plate, 4 hole  OPERATIVE FINDINGS: Displaced left distal radius and ulna fractures  OPERATIVE PROCEDURE: Patient was seen in the preoperative area. I marked the operative hand with the word yes and my initials according the hospital's correct site of surgery protocol. Patient was then brought to the operating roomand was placed supine on the operative table and underwent general anesthesia with an LMA.   The operative arm was prepped and draped in a sterile fashion. A timeout performed to verify the patient's name, date of birth, medical record number, correct site of surgery correct procedure to be performed. The timeout was also used a timeout to verify patient received antibiotics and appropriate instruments, implants and radiographs studies were available in the room. Once all in attendance  were in agreement case began.   Patient then had the operative extremity exsanguinated with an Esmarch. The tourniquet was placed on the upper extremity and inflated 250 mm.  A manual reduction of the fracture was performed. The fracture reduction was confirmed on FluoroScan imaging.  A linear incision was then made over the FCR tendon. The subcutaneous tissue was carefully dissected using Metzenbaum scissor and Adson pickup. Retractors were used to protect the radial artery and median nerve. The pronator quadratus was identified and incised and elevated off the volar surface of the distal radius. Extensive dissection was carried out to free up the displaced fracture fragment as it had already started to heal in place. This fragment was then levered in place and positioned.  Fluoroscopy showed good length and alignment. A 3  hole [employer] volar plate was then positioned on the under surface of the distal radius. It was held into position with a K wire. The position of the plate was confirmed on AP and lateral images. Once the plate was in good position a cortical screw was placed bicortically in the sliding hole. Attention was then turned to the distal pegs. The proximal row of pegs was placed first. Each individual peg hole was drilled and then measured with a depth gauge. The proximal row had 3 threaded pegs placed. The distal row was then drilled and smooth pegs were placed. The position and length of all screws were confirmed on AP and lateral FluoroScan imaging. Care was taken to avoid penetration of any peg through the articular surface of the distal radius.  Once all distal pegs were placed, the attention was turned back to placement of bicortical shaft screws. Additional screws were  placed in the plate to fill the remaining holes. The wound was then copiously irrigated. Final FluoroScan imaging of the construct were taken. The fracture was inexcellent position and the hardware was  well-positioned. The wound again was copiously irrigated. The soft tissue was then carefully over the plate. The tissues were infiltrated with 1/2% marcaine.  The skin was closed with staples. Xeroform and a dry sterile dressing were applied along with a volar splint. I was scrubbed and present for the entire case and all sharp and instrument counts were correct at the conclusion the case. The patient tolerated this procedure well and was awakened and taken to the recovery room in good condition.   Deeann Saint, MD

## 2020-07-13 NOTE — H&P (Signed)
THE PATIENT WAS SEEN PRIOR TO SURGERY TODAY.  HISTORY, ALLERGIES, HOME MEDICATIONS AND OPERATIVE PROCEDURE WERE REVIEWED. RISKS AND BENEFITS OF SURGERY DISCUSSED WITH PATIENT AGAIN.  NO CHANGES FROM INITIAL HISTORY AND PHYSICAL NOTED.   THE PATIENT HAS BEEN SEEN AS A HIGH RISK PATIENT DUE TO SEVERE COPD AND EFFORTS WERE MADE TO AVOID DURGERY, BUT THE FRACTURE IS IN VERY POOR POSITION AND I FEEL THAT SURGERY IS INDICATED TO CORRECT THIS.

## 2020-07-13 NOTE — Anesthesia Preprocedure Evaluation (Signed)
Anesthesia Evaluation  Patient identified by MRN, date of birth, ID band Patient awake    Reviewed: Allergy & Precautions, H&P , NPO status , reviewed documented beta blocker date and time   Airway Mallampati: IV  TM Distance: >3 FB Neck ROM: limited    Dental  (+) Edentulous Upper, Edentulous Lower   Pulmonary COPD,  oxygen dependent, former smoker,    Pulmonary exam normal        Cardiovascular Normal cardiovascular exam     Neuro/Psych PSYCHIATRIC DISORDERS Anxiety Depression    GI/Hepatic neg GERD  ,  Endo/Other  diabetes  Renal/GU      Musculoskeletal   Abdominal   Peds  Hematology   Anesthesia Other Findings Past Medical History: No date: Anxiety     Comment:  per patient No date: COPD (chronic obstructive pulmonary disease) (HCC)     Comment:  oxygen dependent No date: Depression     Comment:  per patient No date: Diabetes mellitus without complication (HCC)  Past Surgical History: No date: EYE SURGERY  BMI    Body Mass Index: 28.35 kg/m      Reproductive/Obstetrics                             Anesthesia Physical Anesthesia Plan  ASA: IV  Anesthesia Plan: General   Post-op Pain Management:  Regional for Post-op pain   Induction: Intravenous  PONV Risk Score and Plan: Ondansetron, Treatment may vary due to age or medical condition, Propofol infusion and TIVA  Airway Management Planned: Nasal Cannula and Natural Airway  Additional Equipment:   Intra-op Plan:   Post-operative Plan:   Informed Consent: I have reviewed the patients History and Physical, chart, labs and discussed the procedure including the risks, benefits and alternatives for the proposed anesthesia with the patient or authorized representative who has indicated his/her understanding and acceptance.     Dental Advisory Given  Plan Discussed with: CRNA and Surgeon  Anesthesia Plan Comments:  (Distal radial ORIF in severe COPD. Will plan Axillary Block w natural airway GA/TIVA. Pt expresses desire to do surgery this way and accepts plan.)        Anesthesia Quick Evaluation

## 2020-07-13 NOTE — Anesthesia Postprocedure Evaluation (Signed)
Anesthesia Post Note  Patient: Danielle Yu  Procedure(s) Performed: ORIF Left Colles Fracture (Left Wrist)  Patient location during evaluation: PACU Anesthesia Type: General Level of consciousness: awake and alert Pain management: pain level controlled (Good block function) Vital Signs Assessment: post-procedure vital signs reviewed and stable Respiratory status: spontaneous breathing, nonlabored ventilation, respiratory function stable and patient connected to nasal cannula oxygen Cardiovascular status: blood pressure returned to baseline and stable Postop Assessment: no apparent nausea or vomiting Anesthetic complications: no   No complications documented.   Last Vitals:  Vitals:   07/13/20 1107 07/13/20 1128  BP: 110/64 (!) 123/56  Pulse: 80 81  Resp: (!) 22 16  Temp: (!) 36.3 C (!) 36.1 C  SpO2: 96% 95%    Last Pain:  Vitals:   07/13/20 1128  TempSrc: Temporal  PainSc: 0-No pain                 Christia Reading

## 2020-07-13 NOTE — Transfer of Care (Signed)
Immediate Anesthesia Transfer of Care Note  Patient: Danielle Yu  Procedure(s) Performed: ORIF Left Colles Fracture (Left Wrist)  Patient Location: PACU  Anesthesia Type:MAC combined with regional for post-op pain  Level of Consciousness: awake  Airway & Oxygen Therapy: Patient Spontanous Breathing  Post-op Assessment: Report given to RN  Post vital signs: stable  Last Vitals:  Vitals Value Taken Time  BP 195/138 07/13/20 1032  Temp    Pulse 88 07/13/20 1032  Resp 23 07/13/20 1032  SpO2 95 % 07/13/20 1032  Vitals shown include unvalidated device data.  Last Pain:  Vitals:   07/13/20 0623  TempSrc: Oral  PainSc: 0-No pain         Complications: No complications documented.

## 2020-07-13 NOTE — Discharge Instructions (Signed)

## 2020-07-14 ENCOUNTER — Encounter: Payer: Self-pay | Admitting: Specialist

## 2020-07-16 MED ORDER — BUPIVACAINE HCL (PF) 0.5 % IJ SOLN
INTRAMUSCULAR | Status: DC | PRN
  Administered 2020-07-13: 20 mL via PERINEURAL

## 2020-07-16 MED ORDER — LIDOCAINE HCL (PF) 1 % IJ SOLN
INTRAMUSCULAR | Status: DC | PRN
  Administered 2020-07-13: 5 mL

## 2020-07-16 MED ORDER — BUPIVACAINE HCL (PF) 0.5 % IJ SOLN: INTRAMUSCULAR | Status: DC | PRN

## 2020-07-16 MED ORDER — LIDOCAINE HCL (PF) 1 % IJ SOLN: INTRAMUSCULAR | Status: DC | PRN

## 2020-07-16 NOTE — Anesthesia Procedure Notes (Signed)
Anesthesia Regional Block: Axillary brachial plexus block   Pre-Anesthetic Checklist: ,, timeout performed, Correct Patient, Correct Site, Correct Laterality, Correct Procedure, Correct Position, site marked, Risks and benefits discussed,  Surgical consent,  Pre-op evaluation,  At surgeon's request and post-op pain management  Laterality: Upper and Left  Prep: chloraprep       Needles:  Injection technique: Single-shot  Needle Type: Echogenic Stimulator Needle     Needle Length: 10cm  Needle Gauge: 21     Additional Needles:   Procedures: Doppler guided,,,, ultrasound used (permanent image in chart),,,,  Motor weakness within 10 minutes.  Narrative:  Start time: 07/13/2020 7:45 AM End time: 07/13/2020 7:50 AM Injection made incrementally with aspirations every 5 mL.  Performed by: Personally  Anesthesiologist: Christia Reading, MD  Additional Notes: Functioning IV was confirmed and O2 [place]/monitors were applied. Light sedation administered as required, patient responsive throughout. A 21ga EchoStim needle was used. Sterile prep and drape,hand hygiene and sterile gloves were used.  Negative aspiration and negative test dose prior to incremental administration of local anesthetic. 1% Lidocaine for skin wheal, 5 ml. Total LA: 25 ml - 0.5% Bupivicaine 42ml/1% lido 5 ml w 1:200 epi. Individual injection of M, R, U, MC trunks. U/S images stored in chart. The patient tolerated the procedure well.

## 2020-07-16 NOTE — Addendum Note (Signed)
Addendum  created 07/16/20 0733 by Christia Reading, MD   Child order released for a procedure order, Clinical Note Signed, Intraprocedure Blocks edited

## 2021-03-14 IMAGING — DX DG WRIST 2V*L*
2 series · 2 of 2 positions shown · non-contrast
Comparison: Left wrist radiographs 06/13/2020

CLINICAL DATA: Post reduction.

EXAM:
LEFT WRIST - 2 VIEW

[wrist obl]
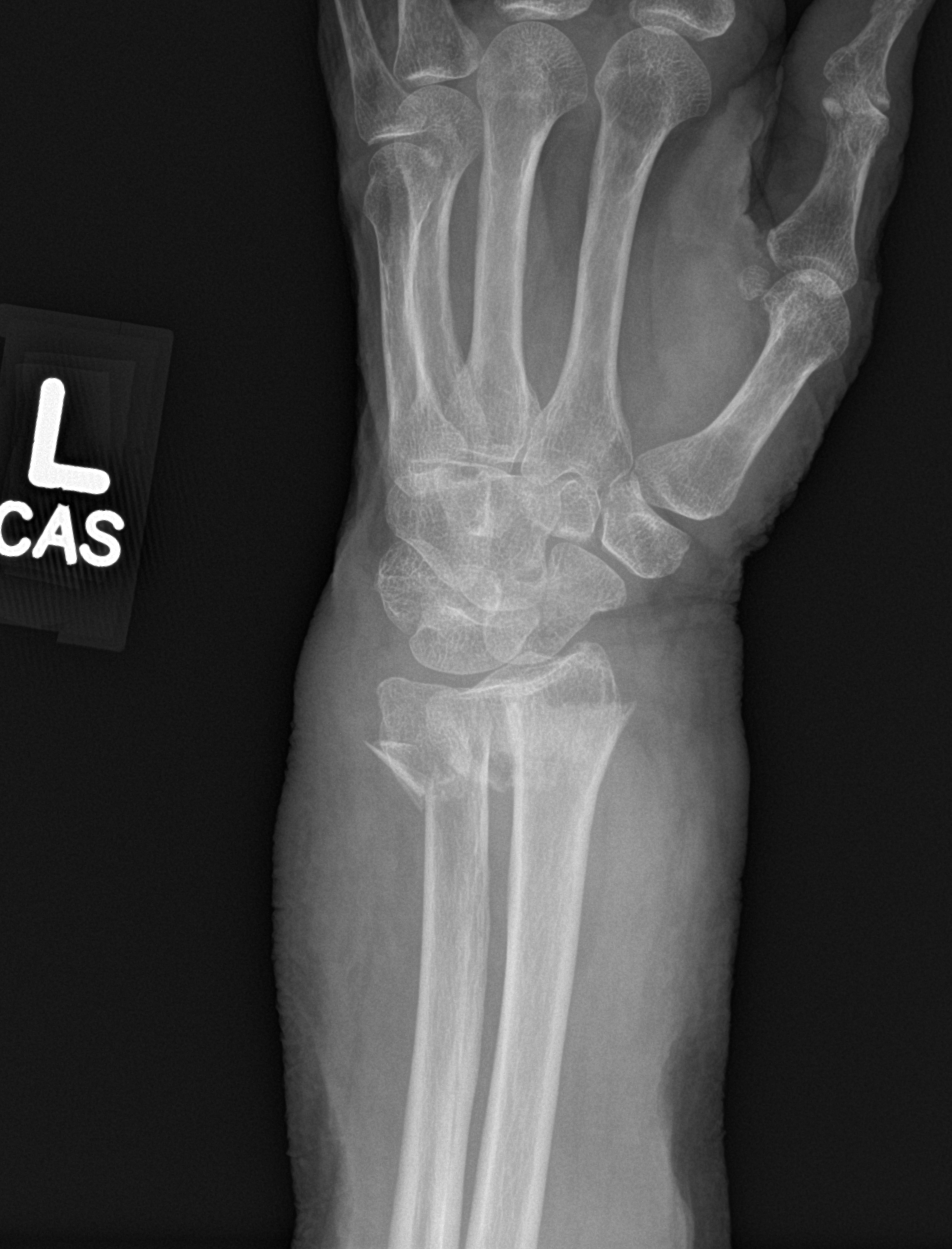

[wrist lat]
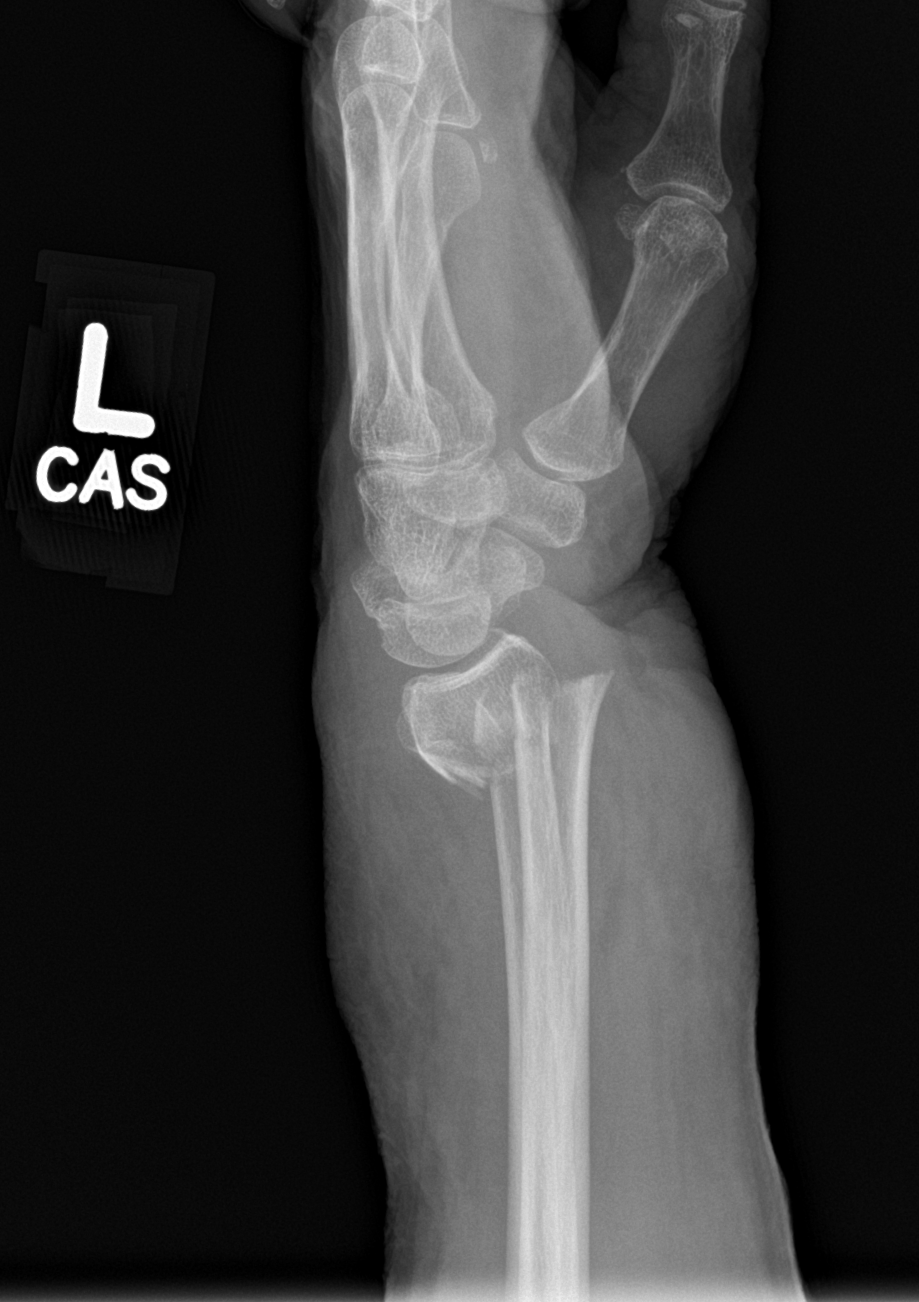

[2 of 2 positions shown; findings below may reference images not displayed]

FINDINGS: Redemonstrated displaced fractures of the distal radius and ulna.
There is decreased dislocation and angulation of both fractures.
There is a remains approximately 8 mm of displacement. No new acute
abnormality identified.
IMPRESSION: Improved alignment status post reduction.

## 2021-03-14 IMAGING — DX DG WRIST COMPLETE 3+V*L*
2 series · 2 of 2 positions shown · non-contrast
Comparison: [DATE] [DATE], [DATE] ([DATE] p.m.)

CLINICAL DATA: Post reduction images.

EXAM:
LEFT WRIST - COMPLETE 3+ VIEW

[wrist ap]
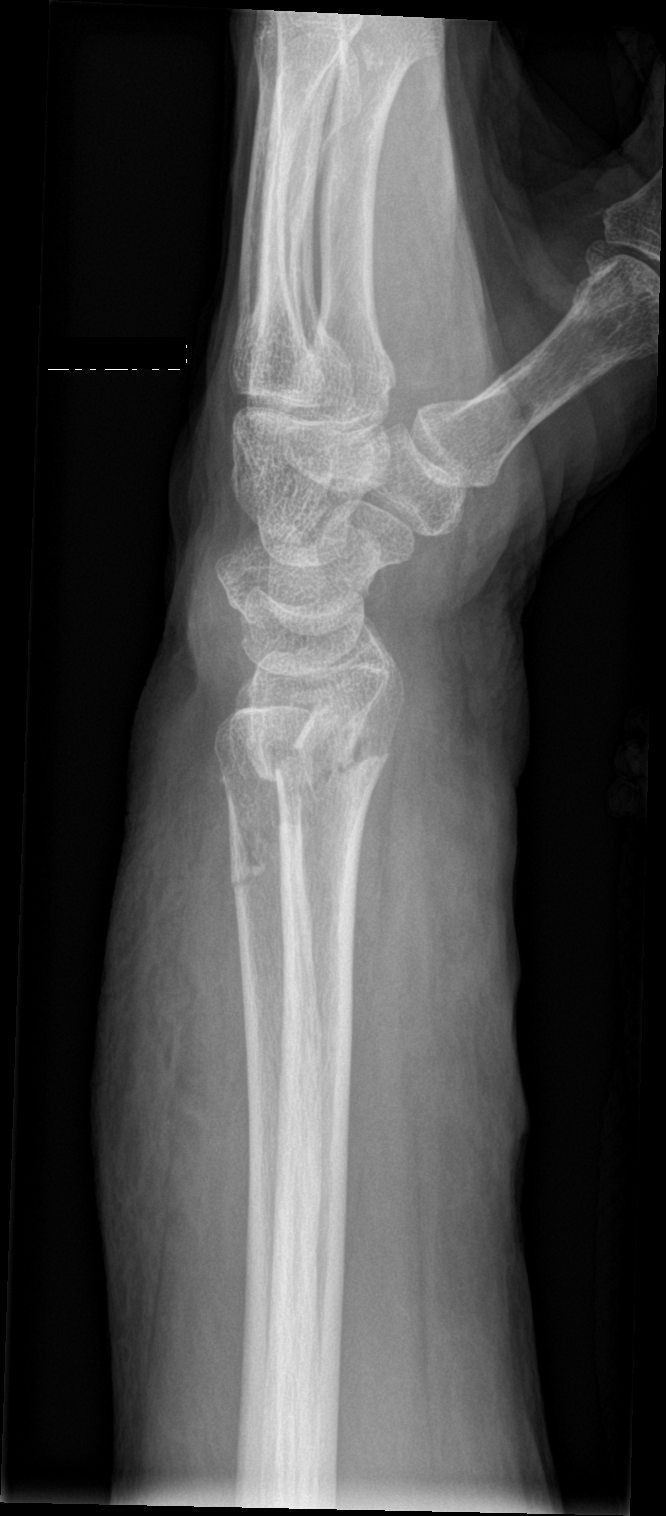

[wrist obl]
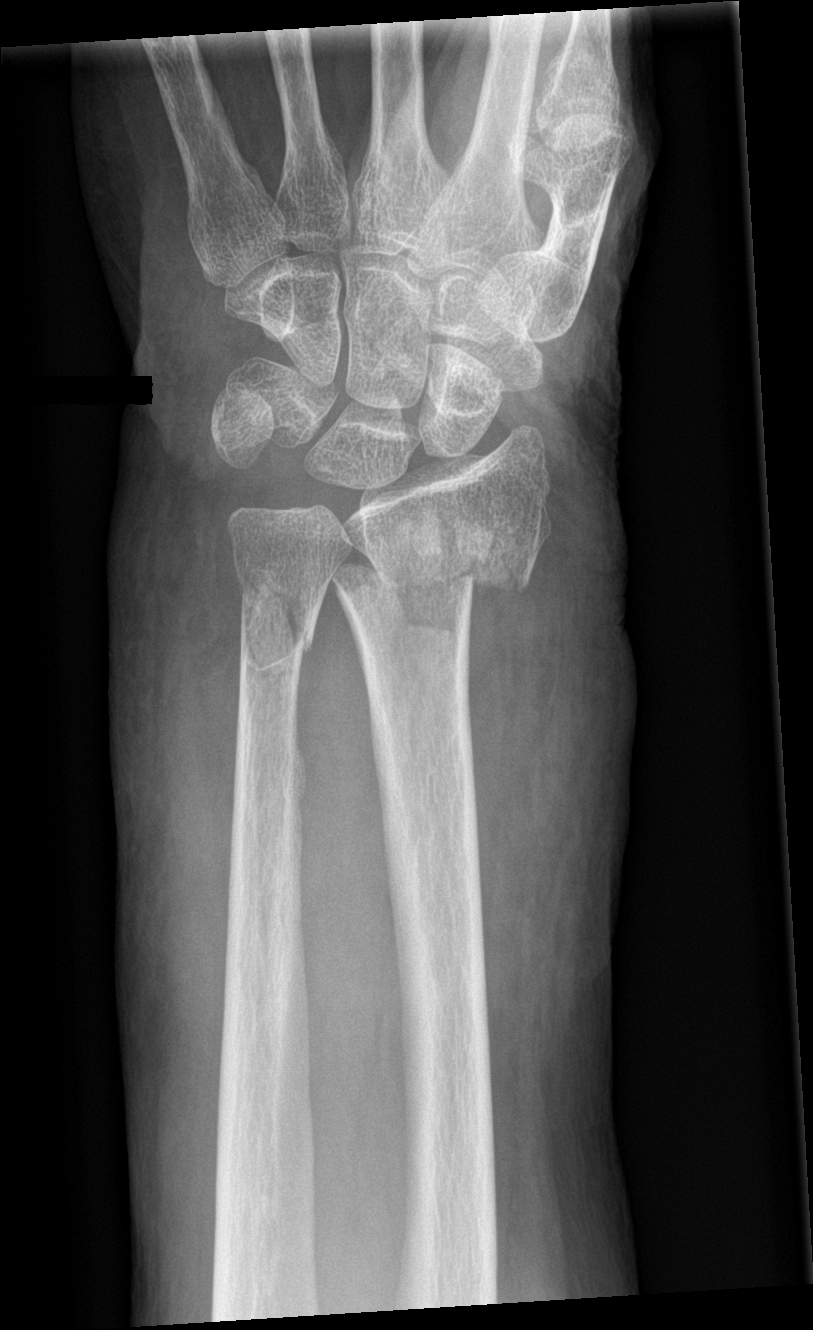

[2 of 2 positions shown; findings below may reference images not displayed]

FINDINGS: Acute fracture deformities are seen involving the distal left radius
and distal left ulna. Approximately [DATE] shaft width dorsal and
lateral displacement of the distal radial fracture site is seen.
There is no evidence of dislocation. There is moderate severity
diffuse soft tissue swelling.
IMPRESSION: Mildly displaced fractures of the distal left radius and distal left
ulna.

## 2022-04-20 ENCOUNTER — Ambulatory Visit: Payer: Medicare Other | Admitting: Dermatology

## 2024-09-12 DEATH — deceased
